# Patient Record
Sex: Female | Born: 1960 | Race: White | Hispanic: No | Smoking: Former smoker
Health system: Southern US, Community
[De-identification: ages and names within clinical notes are randomized; demographics above are authoritative.]

## PROBLEM LIST (undated history)

## (undated) DIAGNOSIS — I639 Cerebral infarction, unspecified: Secondary | ICD-10-CM

## (undated) HISTORY — PX: COLON SURGERY: SHX602

## (undated) HISTORY — PX: COLOSTOMY: SHX63

---

## 2005-12-14 ENCOUNTER — Emergency Department (HOSPITAL_COMMUNITY): Admission: EM | Admit: 2005-12-14 | Discharge: 2005-12-14 | Payer: Self-pay | Admitting: Emergency Medicine

## 2006-01-07 ENCOUNTER — Emergency Department (HOSPITAL_COMMUNITY): Admission: EM | Admit: 2006-01-07 | Discharge: 2006-01-07 | Payer: Self-pay | Admitting: Emergency Medicine

## 2007-10-13 ENCOUNTER — Emergency Department: Payer: Self-pay | Admitting: Emergency Medicine

## 2008-07-17 ENCOUNTER — Inpatient Hospital Stay: Payer: Self-pay | Admitting: Internal Medicine

## 2009-11-30 IMAGING — CR DG ABDOMEN 2V
1 series · 3 of 3 positions shown · non-contrast
Comparison: none

REASON FOR EXAM: follow up [REDACTED] obst.
COMMENTS:

[Series 1: view not recorded · 0.17mm/px · 3 of 3 slices shown]
[im 1/3]
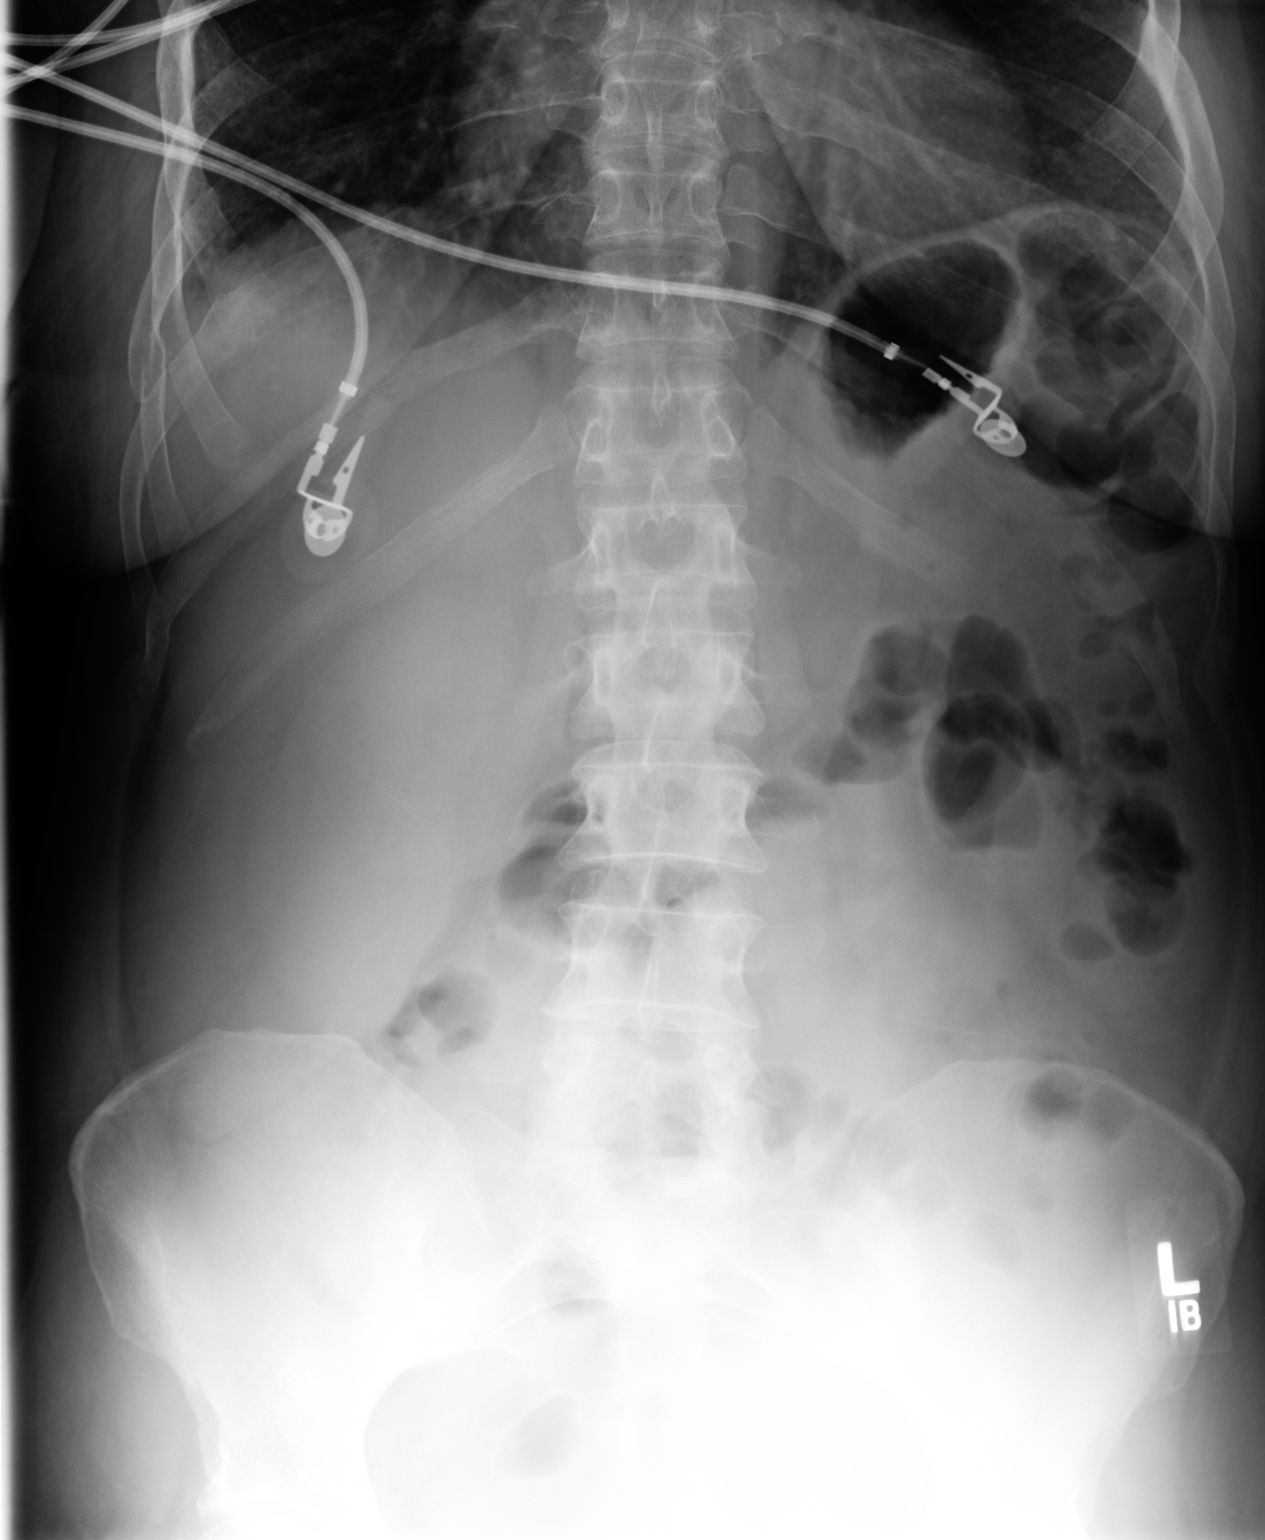
[im 2/3]
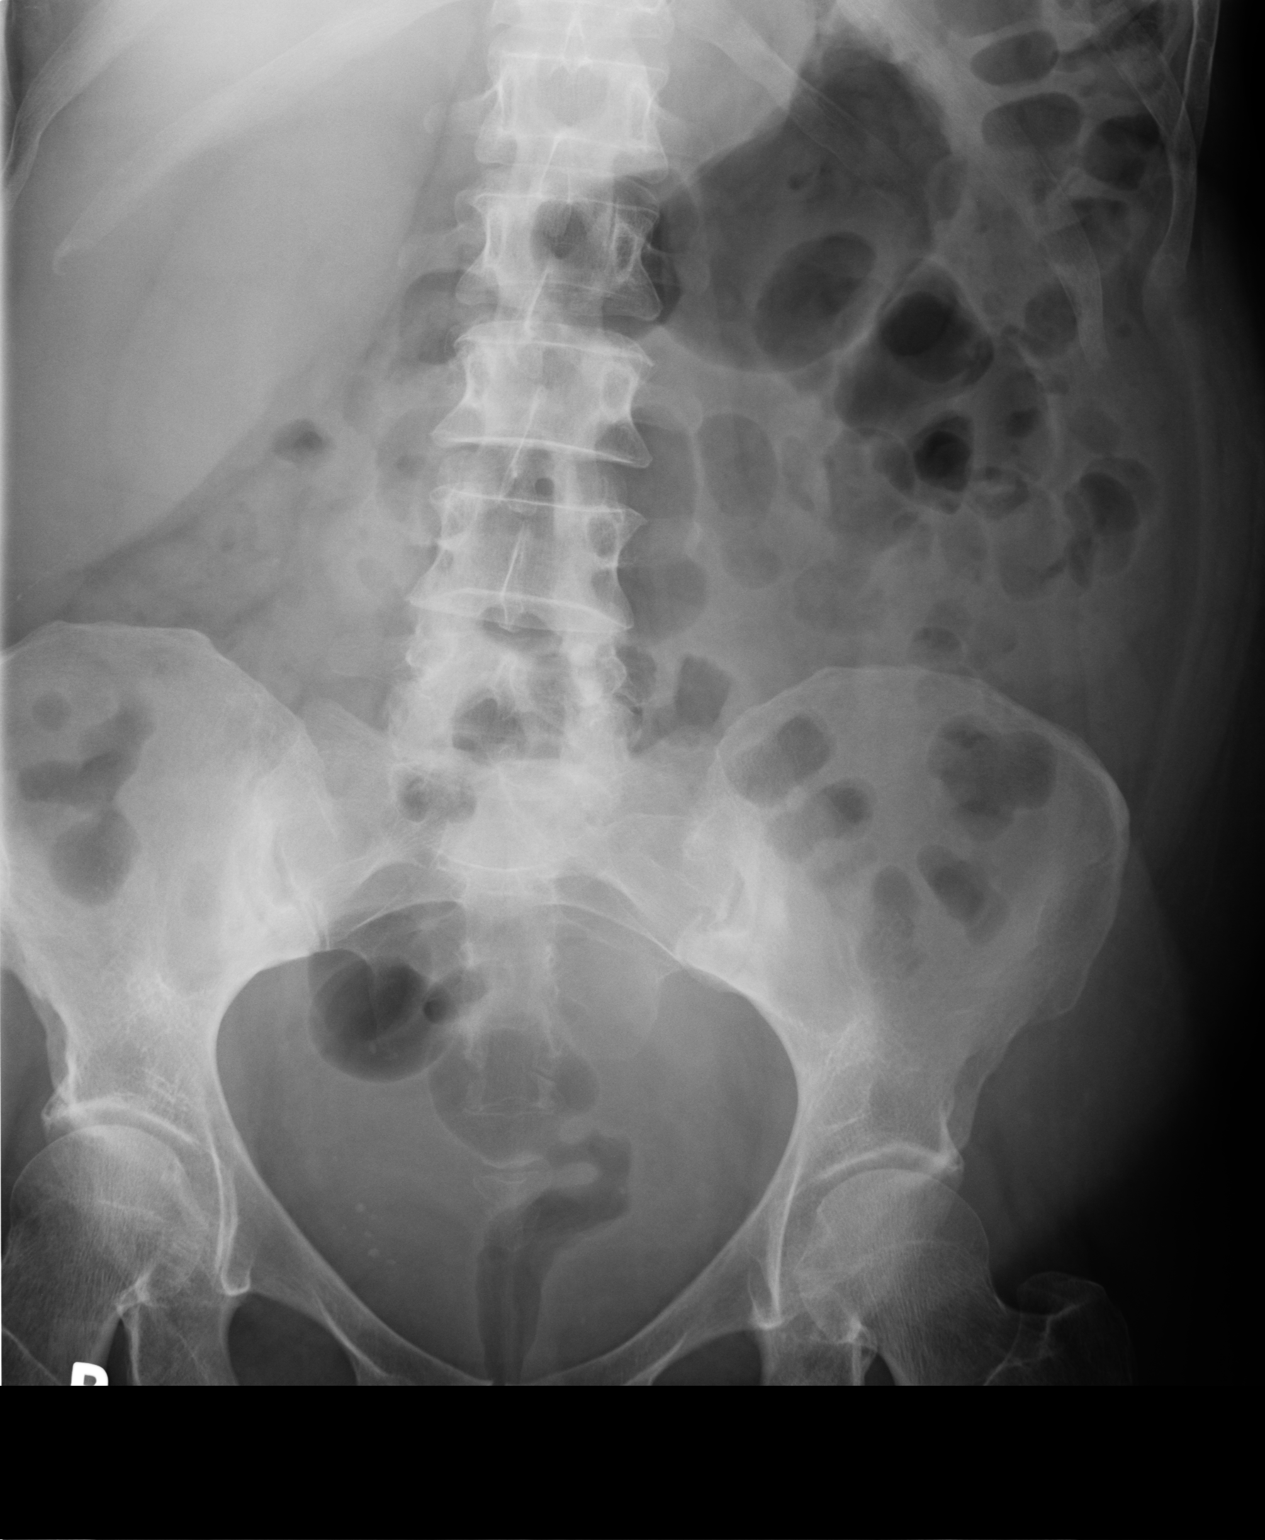
[im 3/3]
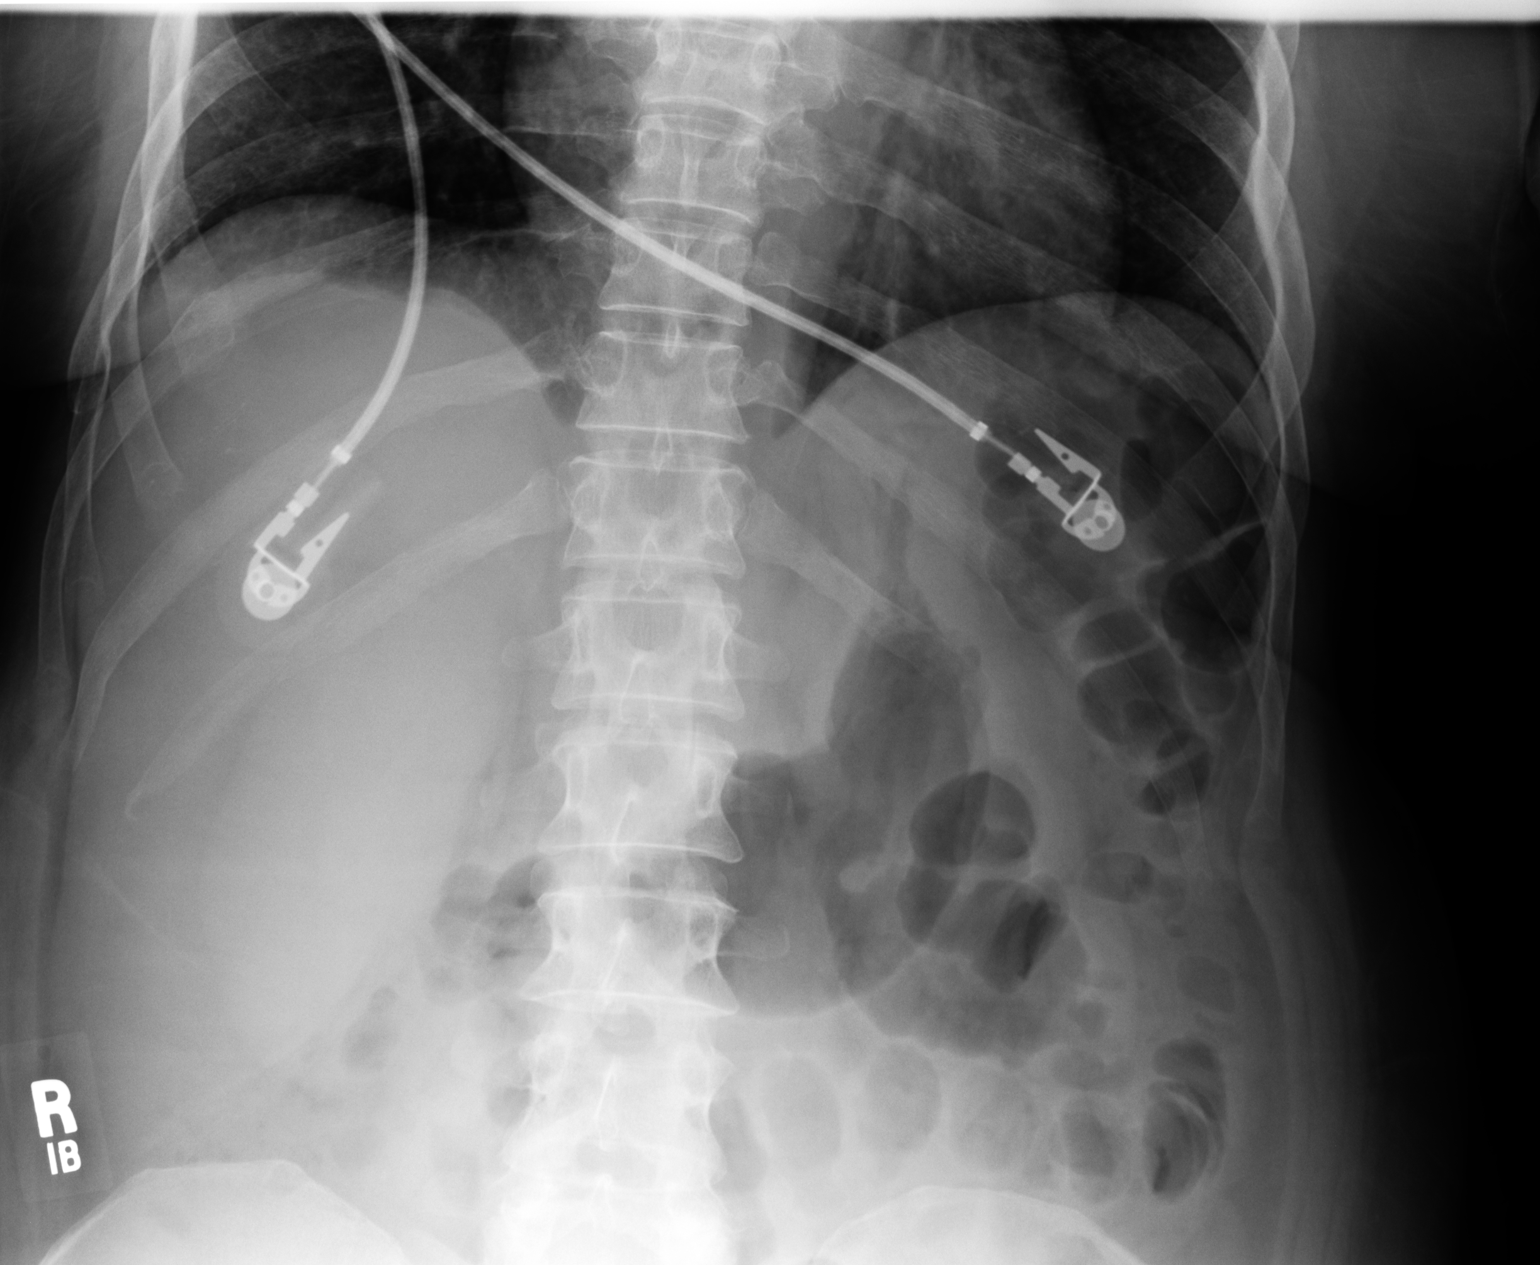

[3 of 3 positions shown; findings below may reference images not displayed]

PROCEDURE:     DXR - DXR ABDOMEN 2 V FLAT AND ERECT  - July 24, 2008  [DATE]

RESULT:     Comparison is made to images dated 07/19/2008 as well as 07/21/2008.

The bowel gas pattern appears to be unremarkable with air and stool
scattered through the colon to the rectum. There is no abnormal bowel
distention. There is no free air. The lung bases are clear.
IMPRESSION: No acute abnormality. No evidence of bowel obstruction or
perforation.

## 2015-02-27 ENCOUNTER — Encounter: Payer: Self-pay | Admitting: *Deleted

## 2015-02-27 ENCOUNTER — Inpatient Hospital Stay
Admission: EM | Admit: 2015-02-27 | Discharge: 2015-03-03 | DRG: 389 | Disposition: A | Discharge status: Home / Self Care | Payer: Medicaid Other | Attending: Surgery | Admitting: Surgery

## 2015-02-27 DIAGNOSIS — R1084 Generalized abdominal pain: Secondary | ICD-10-CM | POA: Insufficient documentation

## 2015-02-27 DIAGNOSIS — Z933 Colostomy status: Secondary | ICD-10-CM

## 2015-02-27 DIAGNOSIS — K439 Ventral hernia without obstruction or gangrene: Secondary | ICD-10-CM | POA: Diagnosis present

## 2015-02-27 DIAGNOSIS — Z4659 Encounter for fitting and adjustment of other gastrointestinal appliance and device: Secondary | ICD-10-CM

## 2015-02-27 DIAGNOSIS — K565 Intestinal adhesions [bands], unspecified as to partial versus complete obstruction: Secondary | ICD-10-CM | POA: Insufficient documentation

## 2015-02-27 DIAGNOSIS — K5090 Crohn's disease, unspecified, without complications: Secondary | ICD-10-CM | POA: Diagnosis present

## 2015-02-27 DIAGNOSIS — N390 Urinary tract infection, site not specified: Secondary | ICD-10-CM

## 2015-02-27 DIAGNOSIS — K56609 Unspecified intestinal obstruction, unspecified as to partial versus complete obstruction: Secondary | ICD-10-CM | POA: Diagnosis present

## 2015-02-27 HISTORY — DX: Cerebral infarction, unspecified: I63.9

## 2015-02-27 LAB — COMPREHENSIVE METABOLIC PANEL
ALK PHOS: 46 U/L (ref 38–126)
ALT: 14 U/L (ref 14–54)
AST: 18 U/L (ref 15–41)
Albumin: 3.9 g/dL (ref 3.5–5.0)
Anion gap: 8 (ref 5–15)
BILIRUBIN TOTAL: 0.6 mg/dL (ref 0.3–1.2)
BUN: 23 mg/dL — ABNORMAL HIGH (ref 6–20)
CALCIUM: 9.3 mg/dL (ref 8.9–10.3)
CO2: 25 mmol/L (ref 22–32)
CREATININE: 1.33 mg/dL — AB (ref 0.44–1.00)
Chloride: 109 mmol/L (ref 101–111)
GFR, EST AFRICAN AMERICAN: 52 mL/min — AB (ref >60)
GFR, EST NON AFRICAN AMERICAN: 45 mL/min — AB (ref >60)
Glucose, Bld: 107 mg/dL — ABNORMAL HIGH (ref 65–99)
Potassium: 3.9 mmol/L (ref 3.5–5.1)
Sodium: 142 mmol/L (ref 135–145)
TOTAL PROTEIN: 6.9 g/dL (ref 6.5–8.1)

## 2015-02-27 LAB — LIPASE, BLOOD: Lipase: 48 U/L (ref 22–51)

## 2015-02-27 LAB — CBC
HCT: 43.9 % (ref 35.0–47.0)
Hemoglobin: 14.5 g/dL (ref 12.0–16.0)
MCH: 29.4 pg (ref 26.0–34.0)
MCHC: 33 g/dL (ref 32.0–36.0)
MCV: 89.1 fL (ref 80.0–100.0)
PLATELETS: 252 10*3/uL (ref 150–440)
RBC: 4.92 MIL/uL (ref 3.80–5.20)
RDW: 14.2 % (ref 11.5–14.5)
WBC: 11.9 10*3/uL — AB (ref 3.6–11.0)

## 2015-02-27 NOTE — ED Notes (Addendum)
Pt states she has had generalized abdominal pain that radiates to her back for several days, denies n/v, "thicker" stool output from her colostomy today. Hx of chron's, feels similar.

## 2015-02-28 ENCOUNTER — Inpatient Hospital Stay: Payer: Medicaid Other

## 2015-02-28 ENCOUNTER — Emergency Department: Payer: Medicaid Other

## 2015-02-28 DIAGNOSIS — Z933 Colostomy status: Secondary | ICD-10-CM | POA: Diagnosis not present

## 2015-02-28 DIAGNOSIS — K5090 Crohn's disease, unspecified, without complications: Secondary | ICD-10-CM | POA: Diagnosis present

## 2015-02-28 DIAGNOSIS — R1084 Generalized abdominal pain: Secondary | ICD-10-CM | POA: Diagnosis not present

## 2015-02-28 DIAGNOSIS — K439 Ventral hernia without obstruction or gangrene: Secondary | ICD-10-CM | POA: Diagnosis present

## 2015-02-28 DIAGNOSIS — K56609 Unspecified intestinal obstruction, unspecified as to partial versus complete obstruction: Secondary | ICD-10-CM | POA: Diagnosis present

## 2015-02-28 DIAGNOSIS — K565 Intestinal adhesions [bands] with obstruction (postprocedural) (postinfection): Secondary | ICD-10-CM | POA: Diagnosis not present

## 2015-02-28 LAB — URINALYSIS COMPLETE WITH MICROSCOPIC (ARMC ONLY)
Bilirubin Urine: NEGATIVE
GLUCOSE, UA: NEGATIVE mg/dL
Hgb urine dipstick: NEGATIVE
Ketones, ur: NEGATIVE mg/dL
NITRITE: NEGATIVE
PROTEIN: NEGATIVE mg/dL
SPECIFIC GRAVITY, URINE: 1.01 (ref 1.005–1.030)
pH: 5 (ref 5.0–8.0)

## 2015-02-28 MED ORDER — ONDANSETRON HCL 4 MG/2ML IJ SOLN
4.0000 mg | Freq: Three times a day (TID) | INTRAMUSCULAR | Status: DC | PRN
  Administered 2015-02-28 – 2015-03-01 (×2): 4 mg via INTRAVENOUS
  Filled 2015-02-28 (×2): qty 2

## 2015-02-28 MED ORDER — PANTOPRAZOLE SODIUM 40 MG IV SOLR
40.0000 mg | INTRAVENOUS | Status: DC
  Administered 2015-02-28 – 2015-03-03 (×2): 40 mg via INTRAVENOUS
  Filled 2015-02-28 (×2): qty 40

## 2015-02-28 MED ORDER — ONDANSETRON HCL 4 MG/2ML IJ SOLN
INTRAMUSCULAR | Status: AC
  Administered 2015-02-28: 4 mg via INTRAVENOUS
  Filled 2015-02-28: qty 2

## 2015-02-28 MED ORDER — HEPARIN SODIUM (PORCINE) 5000 UNIT/ML IJ SOLN
5000.0000 [IU] | Freq: Three times a day (TID) | INTRAMUSCULAR | Status: DC
  Filled 2015-02-28 (×4): qty 1

## 2015-02-28 MED ORDER — SODIUM CHLORIDE 0.9 % IV SOLN
INTRAVENOUS | Status: DC
  Administered 2015-02-28 – 2015-03-01 (×3): via INTRAVENOUS

## 2015-02-28 MED ORDER — MORPHINE SULFATE (PF) 4 MG/ML IV SOLN
4.0000 mg | Freq: Once | INTRAVENOUS | Status: AC
  Administered 2015-02-28: 4 mg via INTRAVENOUS
  Filled 2015-02-28: qty 1

## 2015-02-28 MED ORDER — IOHEXOL 240 MG/ML SOLN: 25.0000 mL | Freq: Once | INTRAMUSCULAR | Status: DC | PRN

## 2015-02-28 MED ORDER — MORPHINE SULFATE (PF) 4 MG/ML IV SOLN
INTRAVENOUS | Status: AC
  Filled 2015-02-28: qty 1

## 2015-02-28 MED ORDER — SODIUM CHLORIDE 0.9 % IV BOLUS (SEPSIS)
1000.0000 mL | Freq: Once | INTRAVENOUS | Status: AC
  Administered 2015-02-28: 1000 mL via INTRAVENOUS

## 2015-02-28 MED ORDER — BENZOCAINE 20 % MT SOLN
OROMUCOSAL | Status: AC
  Administered 2015-02-28: 1
  Filled 2015-02-28: qty 5

## 2015-02-28 MED ORDER — IOHEXOL 300 MG/ML  SOLN
75.0000 mL | Freq: Once | INTRAMUSCULAR | Status: AC | PRN
  Administered 2015-02-28: 75 mL via INTRAVENOUS

## 2015-02-28 MED ORDER — ONDANSETRON HCL 4 MG/2ML IJ SOLN
4.0000 mg | Freq: Once | INTRAMUSCULAR | Status: AC
  Administered 2015-02-28: 4 mg via INTRAVENOUS

## 2015-02-28 MED ORDER — MORPHINE SULFATE (PF) 4 MG/ML IV SOLN
4.0000 mg | Freq: Once | INTRAVENOUS | Status: AC
  Administered 2015-02-28: 4 mg via INTRAVENOUS

## 2015-02-28 MED ORDER — HYDROMORPHONE HCL 1 MG/ML IJ SOLN
0.5000 mg | INTRAMUSCULAR | Status: DC | PRN
  Administered 2015-02-28 – 2015-03-03 (×12): 0.5 mg via INTRAVENOUS
  Filled 2015-02-28 (×11): qty 1

## 2015-02-28 MED ORDER — ONDANSETRON HCL 4 MG/2ML IJ SOLN
4.0000 mg | Freq: Once | INTRAMUSCULAR | Status: AC
  Administered 2015-02-28: 4 mg via INTRAVENOUS
  Filled 2015-02-28: qty 2

## 2015-02-28 MED ORDER — BENZOCAINE (TOPICAL) 20 % EX AERO: INHALATION_SPRAY | Freq: Four times a day (QID) | CUTANEOUS | Status: DC | PRN

## 2015-02-28 MED ORDER — HYDROMORPHONE HCL 1 MG/ML IJ SOLN
INTRAMUSCULAR | Status: AC
  Administered 2015-02-28: 0.5 mg via INTRAVENOUS
  Filled 2015-02-28: qty 1

## 2015-02-28 MED ORDER — IOHEXOL 240 MG/ML SOLN
50.0000 mL | Freq: Once | INTRAMUSCULAR | Status: AC | PRN
  Administered 2015-02-28: 50 mL via ORAL

## 2015-02-28 MED ORDER — BENZOCAINE (TOPICAL) 20 % EX AERO: INHALATION_SPRAY | Freq: Once | CUTANEOUS | Status: AC

## 2015-02-28 NOTE — H&P (Addendum)
CC: Lower abdominal pain x 2-3 days.  HPI: Michelle Thornton is a pleasant 54 yo F with a history of approx 1 month of abdominal pain which has gotten more severe over the past 2-3 days.  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  Still with ostomy output.  Pain is located over LLQ below ostomy.  No fevers/chills, night sweats, shortness of breath, cough, chest pain, dysuria/hematuria.  Active Ambulatory Problems    Diagnosis Date Noted  . No Active Ambulatory Problems   Resolved Ambulatory Problems    Diagnosis Date Noted  . No Resolved Ambulatory Problems   Past Medical History  Diagnosis Date  . Stroke    Past Surgical History  Procedure Laterality Date  . Colon surgery    . Colostomy    H/o open cholecystectomy     Medication List    Notice    You have not been prescribed any medications.    No Known Allergies   Social History   Social History  . Marital Status: Divorced    Spouse Name: N/A  . Number of Children: N/A  . Years of Education: N/A   Occupational History  . Not on file.   Social History Main Topics  . Smoking status: Never Smoker   . Smokeless tobacco: Not on file  . Alcohol Use: No  . Drug Use: No  . Sexual Activity: Not on file   Other Topics Concern  . Not on file   Social History Narrative  No family history on file.   ROS: Full ROS obtained, pertinent positives and negatives as above  Blood pressure 139/83, pulse 82, temperature 98.8 F (37.1 C), temperature source Oral, resp. rate 16, height 5\' 5"  (1.651 m), weight 180 lb (81.647 kg), SpO2 94 %. GEN: NAD/A&Ox3 FACE: no obvious facial trauma, normal external nose, normal external ears EYES: no scleral icterus, no conjunctivitis HEAD: normocephalic atraumatic CV: RRR, no MRG RESP: moving air well, lungs clear ABD: soft, mild tender LLQ, ostomy pink (appears to be an end on  exam), nondistended, nontender periumbilical hernia  EXT: moving all ext well, strength 5/5 NEURO: cnII-XII grossly intact, sensation intact all 4 ext  Labs: Personally reviewed, significant for WBC 11.9  Imaging: Personally reviewed, significant for  Dilated proximal SB loops, LLQ ventral wall hernia, distal SB decompression ? Contrast appears past obstruction and also in distal colon? Which would not be consistent with an ileostomy  A/P 54 yo with likely partial SBO from ventral hernia.  No indication for emergent surgery (no tachycardia, no s/sx of bowel ischemia).  Will treat as SBO with NG decompression.  Obtain OP records.  I have discussed plan with patient and she agrees.

## 2015-02-28 NOTE — ED Provider Notes (Signed)
[facility] Emergency Department Provider Note  ____________________________________________  Time seen: Approximately 12:46 AM  I have reviewed the triage vital signs and the nursing notes.   HISTORY  Chief Complaint Abdominal Pain    HPI Michelle Thornton is a 54 y.o. female who presents to the ED from home with a chief complain of abdominal pain. Patient has a history significant for Crohn's disease s/p colostomy. Complains of a several day history of generalized abdominal pain, cramping in nature, waxing/waning which has progressively worsened. Symptoms are associated with nausea only. Patient states she has been experiencing alternating constipation and diarrhea from her ostomy bag. Denies fever, chills, chest pain, shortness of breath, dysuria, headache, weakness, numbness, tingling. Denies recent travel. Recently finished a 10 day course of steroids for COPD exacerbation. She does not take maintenance medicines for her Crohn's disease.   Past Medical History  Diagnosis Date  . Stroke   Crohn's disease SBO COPD  There are no active problems to display for this patient.   Past Surgical History  Procedure Laterality Date  . Colon surgery    . Colostomy      No current outpatient prescriptions on file.  Allergies Review of patient's allergies indicates no known allergies.  No family history on file.  Social History Social History  Substance Use Topics  . Smoking status: Never Smoker   . Smokeless tobacco: None  . Alcohol Use: No    Review of Systems Constitutional: No fever/chills Eyes: No visual changes. ENT: No sore throat. Cardiovascular: Denies chest pain. Respiratory: Denies shortness of breath. Gastrointestinal: Positive for abdominal pain.  Positive for nausea, no vomiting.  No diarrhea.  No constipation. Genitourinary: Negative for dysuria. Musculoskeletal: Negative for back pain. Skin: Negative for rash. Neurological:  Negative for headaches, focal weakness or numbness.  10-point ROS otherwise negative.  ____________________________________________   PHYSICAL EXAM:  VITAL SIGNS: ED Triage Vitals  Enc Vitals Group     BP 02/27/15 2306 105/75 mmHg     Pulse Rate 02/27/15 2306 77     Resp 02/27/15 2306 18     Temp 02/27/15 2306 98.8 F (37.1 C)     Temp Source 02/27/15 2306 Oral     SpO2 02/27/15 2306 95 %     Weight 02/27/15 2306 180 lb (81.647 kg)     Height 02/27/15 2306  (1.651 m)     Head Cir --      Peak Flow --      Pain Score 02/27/15 2306 6     Pain Loc --      Pain Edu? --      Excl. in GC? --     Constitutional: Alert and oriented. Well appearing and in mild acute distress. Eyes: Conjunctivae are normal. PERRL. EOMI. Head: Atraumatic. Nose: No congestion/rhinnorhea. Mouth/Throat: Mucous membranes are moist.  Oropharynx non-erythematous. Neck: No stridor.   Cardiovascular: Normal rate, regular rhythm. Grossly normal heart sounds.  Good peripheral circulation. Respiratory: Normal respiratory effort.  No retractions. Lungs CTAB. Gastrointestinal: Soft, tender to palpation diffusely without rebound or guarding. Moderate distention. Stool in ostomy bag. No abdominal bruits. No CVA tenderness. Musculoskeletal: No lower extremity tenderness nor edema.  No joint effusions. Neurologic:  Normal speech and language. No gross focal neurologic deficits are appreciated. No gait instability. Skin:  Skin is warm, dry and intact. No rash noted. Psychiatric: Mood and affect are normal. Speech and behavior are normal.  ____________________________________________   LABS (all labs ordered are listed,  but only abnormal results are displayed)  Labs Reviewed  COMPREHENSIVE METABOLIC PANEL - Abnormal; Notable for the following:    Glucose, Bld 107 (*)    BUN 23 (*)    Creatinine, Ser 1.33 (*)    GFR calc non Af Amer 45 (*)    GFR calc Af Amer 52 (*)    All other components within normal  limits  CBC - Abnormal; Notable for the following:    WBC 11.9 (*)    All other components within normal limits  URINALYSIS COMPLETEWITH MICROSCOPIC ([facility] ONLY) - Abnormal; Notable for the following:    Color, Urine STRAW (*)    APPearance CLEAR (*)    Leukocytes, UA 1+ (*)    Bacteria, UA RARE (*)    Squamous Epithelial / LPF 0-5 (*)    All other components within normal limits  LIPASE, BLOOD   ____________________________________________  EKG  ED ECG REPORT I, SUNG,JADE J, the attending physician, personally viewed and interpreted this ECG.   Date: 02/28/2015  EKG Time: 2320  Rate: 68  Rhythm: normal EKG, normal sinus rhythm  Axis: Normal  Intervals:none  ST&T Change: Nonspecific  ____________________________________________  RADIOLOGY  CT Abdomen/Pelvis interpreted per Dr. Manus Gunning: 1. Findings consistent with small-bowel obstruction, with transition point likely related to left lower ventral abdominal wall hernia. 2. Postsurgical change with ostomy in the right lower quadrant and moderate parastomal hernia. 3. Mild soft tissue stranding in the pelvis adjacent to the dilated small bowel loop, likely related small bowel obstruction. Active Crohn's disease not entirely excluded, however there is no definite bowel wall thickening. There is otherwise no evidence of active Crohn's disease. ____________________________________________   PROCEDURES  Procedure(s) performed: None  Critical Care performed: No  ____________________________________________   INITIAL IMPRESSION / ASSESSMENT AND PLAN / ED COURSE  Pertinent labs & imaging results that were available during my care of the patient were reviewed by me and considered in my medical decision making (see chart for details).  54 year old female with a significant history for Crohn's disease s/p colostomy who presents with a several day history of generalized abdominal pain associated with nausea. Will initiate  IV fluid resuscitation, IV analgesia and obtain CT abdomen/pelvis to evaluate infectious/inflammatory etiology of patient's pain. Will hold off on steroids for now given patient's recent 10 day course of prednisone.  ----------------------------------------- 4:34 AM on 02/28/2015 -----------------------------------------  Updated patient of CT findings of SBO. Discussed case with Dr. Juliann Pulse who will evaluate patient in the ED for admission. Will place NG tube for bowel decompression. ____________________________________________   FINAL CLINICAL IMPRESSION(S) / ED DIAGNOSES  Final diagnoses:  Generalized abdominal pain  UTI (lower urinary tract infection)      Irean Hong, MD 02/28/15 [phone]

## 2015-02-28 NOTE — Progress Notes (Signed)
Pt refused heparin, will have day shift nurse pass on to MD. Rochele Pages protonix with a lot of explanation about medication.

## 2015-02-28 NOTE — Progress Notes (Signed)
Pt assisted tio bedside commode for toileting, NGTube to moderate continuous suction  Thick brown  With odor

## 2015-02-28 NOTE — Progress Notes (Signed)
Spoke with dr Michela Pitcher pt may have ice chips

## 2015-02-28 NOTE — ED Notes (Signed)
MD at bedside. 

## 2015-02-28 NOTE — Progress Notes (Addendum)
Pt developed sharp abdominal pain. NG tube was hooked to intermittent low suction at the time when pain developed. Suction turned off and abdominal pain subsided. NG tube in at 20 when pt was transferred to floor. Dr. Excell Seltzer notified. New order for KUB. Will report results to MD & continue to monitor.

## 2015-02-28 NOTE — Progress Notes (Signed)
Pt sleeping no complaints at present

## 2015-02-28 NOTE — Progress Notes (Signed)
Pt left floor with orderly to 2C.

## 2015-02-28 NOTE — Progress Notes (Signed)
Dr Orvis Brill examining pt  Irrigated NG tube , drained 400 brow liquid .

## 2015-02-28 NOTE — Progress Notes (Signed)
Report called to [facility] on General Surgery.

## 2015-03-01 ENCOUNTER — Inpatient Hospital Stay: Payer: Medicaid Other

## 2015-03-01 MED ORDER — ACETAMINOPHEN 120 MG RE SUPP
120.0000 mg | Freq: Once | RECTAL | Status: AC
  Administered 2015-03-01: 120 mg via RECTAL
  Filled 2015-03-01: qty 1

## 2015-03-01 MED ORDER — NICOTINE 10 MG IN INHA
1.0000 | RESPIRATORY_TRACT | Status: DC | PRN
  Administered 2015-03-01: 1 via RESPIRATORY_TRACT
  Filled 2015-03-01: qty 36

## 2015-03-01 MED ORDER — ACETAMINOPHEN 80 MG RE SUPP
80.0000 mg | Freq: Once | RECTAL | Status: DC
  Filled 2015-03-01: qty 1

## 2015-03-01 MED ORDER — KCL IN DEXTROSE-NACL 40-5-0.45 MEQ/L-%-% IV SOLN
INTRAVENOUS | Status: DC
  Administered 2015-03-01 – 2015-03-02 (×4): via INTRAVENOUS
  Filled 2015-03-01 (×7): qty 1000

## 2015-03-01 NOTE — Progress Notes (Addendum)
Pt transferred from 1A Room 149 to Room 216. Received report from [place] on 1A.  Pt A&O x4. IV access intact & infusing. Pt with NG tube to low intermittent wall suction. Medications administered as ordered (See MAR). Admission & Assessment completed.   Oriented pt to room. Education provided on call bell, telephone, IVpole/IV alarms. Education presented on white board as a pt/nurse Public relations account executive. White board completed and updated as needed.  Reviewed diet/medication orders with pt. Addressed all of the pt questions & concerns.  Pt has vaporizer in room. Educated pt on reasons not to use it while in hospital. Pt states "I am going to use it anyway regardless".  VSS. Will continue to monitor.

## 2015-03-01 NOTE — Progress Notes (Signed)
T 100.5 All other VSS. Dr. Excell Seltzer notified. New order for 1 tylenol suppository now.

## 2015-03-01 NOTE — Progress Notes (Signed)
Subjective:  she is still having moderate pain but is more comfortable since her last evaluation. She does not have any nausea and does appear to have betterN G output. She has films pending this morning.  Vital signs in last 24 hours: Temp:  [97.7 F (36.5 C)-100.5 F (38.1 C)] 98.7 F (37.1 C) (09/17 0650) Pulse Rate:  [57-69] 66 (09/17 0650) Resp:  [16-24] 16 (09/17 0650) BP: (101-135)/(52-74) 103/52 mmHg (09/17 0650) SpO2:  [93 %-96 %] 93 % (09/17 0650) Last BM Date: 02/28/15  Intake/Output from previous day: 09/16 0701 - 09/17 0700 In: 2396 [I.V.:2046; NG/GT:350] Out: 2340 [Urine:1650; Emesis/NG output:640]  GI: her abdomen is soft and she does not appear to have any incarceration of either hernia in the midline. She has hypoactive but present bowel sounds with some high-pitched tinkling sounds. She does have good output in her ostomy.N  Lab Results:  CBC  Recent Labs  02/27/15 2310  WBC 11.9*  HGB 14.5  HCT 43.9  PLT 252   CMP     Component Value Date/Time   NA 142 02/27/2015 2310   K 3.9 02/27/2015 2310   CL 109 02/27/2015 2310   CO2 25 02/27/2015 2310   GLUCOSE 107* 02/27/2015 2310   BUN 23* 02/27/2015 2310   CREATININE 1.33* 02/27/2015 2310   CALCIUM 9.3 02/27/2015 2310   PROT 6.9 02/27/2015 2310   ALBUMIN 3.9 02/27/2015 2310   AST 18 02/27/2015 2310   ALT 14 02/27/2015 2310   ALKPHOS 46 02/27/2015 2310   BILITOT 0.6 02/27/2015 2310   GFRNONAA 45* 02/27/2015 2310   GFRAA 52* 02/27/2015 2310   PT/INR No results for input(s): LABPROT, INR in the last 72 hours.  Studies/Results: Dg Abd 1 View  02/28/2015   CLINICAL DATA:  NG tube placement.  EXAM: ABDOMEN - 1 VIEW  COMPARISON:  CT performed yesterday.  FINDINGS: An enteric tube is not visualized in the upper abdomen or lower most lung bases. There is gaseous distention of stomach and small bowel in the central abdomen.  IMPRESSION: Enteric tube not visualized. Repeat exam recommended after  advancement to include the lower thorax and upper abdomen.  These results were called by telephone at the time of interpretation on 02/28/2015 at 11:17 pm to Janyce Llanos, nurse caring for the patient, who is aware can verbally acknowledged these results.   Electronically Signed   By: Rubye Oaks M.D.   On: 02/28/2015 23:18   Ct Abdomen Pelvis W Contrast  02/28/2015   CLINICAL DATA:  Generalized abdominal pain radiating to the back for several days, nausea and bloating. History of Crohn's with colostomy.  EXAM: CT ABDOMEN AND PELVIS WITH CONTRAST  TECHNIQUE: Multidetector CT imaging of the abdomen and pelvis was performed using the standard protocol following bolus administration of intravenous contrast.  CONTRAST:  75mL OMNIPAQUE IOHEXOL 300 MG/ML  SOLN  COMPARISON:  CT 07/18/2008  FINDINGS: Minimal atelectasis in the left lower lobe. The previous small pleural-based nodule is no longer seen.  The stomach is distended with contrast and ingested material. There are dilated small bowel loops in the central and upper abdomen. A transition point is seen related to left lateral lower abdominal wall ventral hernia, with bowel loops distal to this decompressed. Right lower quadrant ostomy with peristomal hernia, no bowel thickening in the parastomal hernia. Upper abdominal wall ventral hernia contains small focus of in situ transverse colon. Colon distal to this is decompressed, however there is mild soft tissue stranding about  the sigmoid colon in the pelvis which is adjacent to a dilated small bowel loop. Mild mesenteric edema in the central mesentery. There is otherwise no evidence of active Crohn's disease. Laxity of the anterior abdominal wall with broad-based periumbilical hernia containing small bowel loops.  Clips in the gallbladder fossa from cholecystectomy. No disproportionate biliary dilatation. The liver appears prominent without focal lesion. The spleen, adrenal glands, and pancreas are normal.  Kidneys demonstrate symmetric enhancement and excretion. Small left renal cyst.  No free air, free fluid, or intra-abdominal fluid collection.  No retroperitoneal adenopathy. Abdominal aorta is normal in caliber.  Within the pelvis the bladder is distended. Uterus remains in situ. No adnexal mass. No pelvic free fluid.  There are no acute or suspicious osseous abnormalities.  IMPRESSION: 1. Findings consistent with small-bowel obstruction, with transition point likely related to left lower ventral abdominal wall hernia. 2. Postsurgical change with ostomy in the right lower quadrant and moderate parastomal hernia. 3. Mild soft tissue stranding in the pelvis adjacent to the dilated small bowel loop, likely related small bowel obstruction. Active Crohn's disease not entirely excluded, however there is no definite bowel wall thickening. There is otherwise no evidence of active Crohn's disease.   Electronically Signed   By: Rubye Oaks M.D.   On: 02/28/2015 04:02    Assessment/Plan: Overall she appears to be improving. She does not have any indications for urgent surgery. We will repeat her films today. We would really like to avoid surgery if possible because of her multiple previous operations. There does not appear to be any evidence of obstruction at this time.

## 2015-03-01 NOTE — Progress Notes (Signed)
Dr. Excell Seltzer notified nurse that KUB results showed NG tube was not in stomach. New orders to advance the tube. Advanced tube to 50 and verified placement. Pt tolerated procedure. NG tube to low intermittent suction as ordered. Scant amount of output at this time. Notified Dr. Excell Seltzer. Will continue to monitor.   Radiology also called to notify me that KUB showed NG tube was not in stomach.

## 2015-03-01 NOTE — Progress Notes (Signed)
Spoke with patient about use of personal vapor with nicotine, offered ordered nicotine inhaler, pt tried prescribed nicotine inhaler, but showed disinterest in it. Pt resting in room. continue to assess.

## 2015-03-02 DIAGNOSIS — K565 Intestinal adhesions [bands], unspecified as to partial versus complete obstruction: Secondary | ICD-10-CM | POA: Insufficient documentation

## 2015-03-02 DIAGNOSIS — R1084 Generalized abdominal pain: Secondary | ICD-10-CM

## 2015-03-02 LAB — CBC
HEMATOCRIT: 40.9 % (ref 35.0–47.0)
Hemoglobin: 13.5 g/dL (ref 12.0–16.0)
MCH: 29.4 pg (ref 26.0–34.0)
MCHC: 33 g/dL (ref 32.0–36.0)
MCV: 89.1 fL (ref 80.0–100.0)
PLATELETS: 205 10*3/uL (ref 150–440)
RBC: 4.59 MIL/uL (ref 3.80–5.20)
RDW: 14.2 % (ref 11.5–14.5)
WBC: 6.7 10*3/uL (ref 3.6–11.0)

## 2015-03-02 LAB — BASIC METABOLIC PANEL
ANION GAP: 4 — AB (ref 5–15)
BUN: 6 mg/dL (ref 6–20)
CO2: 27 mmol/L (ref 22–32)
Calcium: 8.7 mg/dL — ABNORMAL LOW (ref 8.9–10.3)
Chloride: 111 mmol/L (ref 101–111)
Creatinine, Ser: 0.7 mg/dL (ref 0.44–1.00)
GFR calc Af Amer: >60 mL/min (ref >60)
GLUCOSE: 115 mg/dL — AB (ref 65–99)
POTASSIUM: 4.1 mmol/L (ref 3.5–5.1)
Sodium: 142 mmol/L (ref 135–145)

## 2015-03-02 MED ORDER — ACETAMINOPHEN 325 MG PO TABS
650.0000 mg | ORAL_TABLET | Freq: Once | ORAL | Status: AC
  Administered 2015-03-02: 650 mg via ORAL
  Filled 2015-03-02: qty 2

## 2015-03-02 NOTE — Progress Notes (Signed)
Michelle Thornton is a 54 y.o. 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 Patient doing well with ng tube out, denies any nausea or abdominal pain this AM.  States she has some soreness in the epigastric region.  She is have gas and stool in the ostomy bag  Filed Vitals:   03/02/15 0526  BP: 98/51  Pulse: 53  Temp: 97.8 F (36.6 C)  Resp: 16   I/O last 3 completed shifts: In: 2998.7 [I.V.:2698.7; NG/GT:300] Out: 790 [Urine:650; Emesis/NG output:90; Other:50] Total I/O In: 465 [I.V.:465] Out: -   General appearance: alert, cooperative and no distress Lungs: clear to auscultation bilaterally Heart: regular rate and rhythm Abdomen: obese, soft, non-tender, well healed surgical scars, RLQ ostomy pink patent, with air and stool in bag Extremities: 2+ pulses, no edema   Labs:  CBC Latest Ref Rng 03/02/2015 02/27/2015  WBC 3.6 - 11.0 K/uL 6.7 11.9(H)  Hemoglobin 12.0 - 16.0 g/dL 16.5 53.7  Hematocrit 48.2 - 47.0 % 40.9 43.9  Platelets 150 - 440 K/uL 205 252   BMP Latest Ref Rng 03/02/2015 02/27/2015  Glucose 65 - 99 mg/dL 707(E) 675(Q)  BUN 6 - 20 mg/dL 6 49(E)  Creatinine 0.10 - 1.00 mg/dL 0.71 2.19(X)  Sodium 588 - 145 mmol/L 142 142  Potassium 3.5 - 5.1 mmol/L 4.1 3.9  Chloride 101 - 111 mmol/L 111 109  CO2 22 - 32 mmol/L 27 25  Calcium 8.9 - 10.3 mg/dL 3.2(P) 9.3   Assessment/Plan: 54 yr old with small bowel obstruction  Improving will give clear liquids and advance as tolerates Decrease IVF

## 2015-03-02 NOTE — Progress Notes (Signed)
Pt c/o "migraine". Dr. Excell Seltzer notified. New orders for tylenol 650 mg PO with a sip of water x1 now. Will continue to monitor.

## 2015-03-03 MED ORDER — IBUPROFEN 600 MG PO TABS
600.0000 mg | ORAL_TABLET | ORAL | Status: DC | PRN
  Administered 2015-03-03: 600 mg via ORAL
  Filled 2015-03-03: qty 1

## 2015-03-03 NOTE — Discharge Instructions (Signed)

## 2015-03-03 NOTE — Clinical Documentation Improvement (Signed)
Hospitalist  Abnormal Lab/Test Results:  Urinalysis documented as +1 leukocytes and rare bacteria.  EDP documented UTI on admission dx.  Possible Clinical Conditions associated with below indicators  UTI  Other Condition  Cannot Clinically Determine   Supporting Information: See above  Treatment Provided: none   Please exercise your independent, professional judgment when responding. A specific answer is not anticipated or expected.   Thank Modesta Messing [facility] Health Information Management [place] [phone]

## 2015-03-03 NOTE — Discharge Summary (Signed)
Patient ID: Michelle Thornton MRN: [id number] DOB/AGE: 07-08-1960 54 y.o.  Admit date: 02/27/2015 Discharge date: 03/03/2015  Discharge Diagnoses:  Bowel Obstruction  Procedures Performed: None  Discharged Condition: good  Hospital Course: Admitted with SBO, improved with conservative therapy. Tolerated PO prior to discharge.  Discharge Orders: Discharge home - f/u PRN   Disposition: Home   Discharge Medications:  Current facility-administered medications:  .  dextrose 5 % and 0.45 % NaCl with KCl 40 mEq/L infusion, , Intravenous, Continuous, Gladis Riffle, MD, Last Rate: 50 mL/hr at 03/02/15 2321 .  heparin injection 5,000 Units, 5,000 Units, Subcutaneous, 3 times per day, Ida Rogue, MD, 5,000 Units at 02/28/15 [phone] .  ibuprofen (ADVIL,MOTRIN) tablet 600 mg, 600 mg, Oral, Q4H PRN, Ricarda Frame, MD, 600 mg at 03/03/15 1233 .  nicotine (NICOTROL) 10 MG inhaler 1 continuous puffing, 1 continuous puffing, Inhalation, PRN, Tiney Rouge III, MD, 1 continuous puffing at 03/01/15 1446 .  ondansetron (ZOFRAN) injection 4 mg, 4 mg, Intravenous, Q8H PRN, Ida Rogue, MD, 4 mg at 03/01/15 0044 .  pantoprazole (PROTONIX) injection 40 mg, 40 mg, Intravenous, Q24H, Ida Rogue, MD, 40 mg at 03/03/15 0515  Follwup: If wanted with [facility], otherwise follow up with [facility] surgeon for continued care.  Signed: Ricarda Frame 03/03/2015, 1:21 PM

## 2015-03-03 NOTE — Progress Notes (Signed)
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 Patient has been tolerating an oral diet without any nausea, vomiting. Patient's only complaint this morning is of a headache. Headache is sinus in quality and related to the same side that her NG tube was placed. Otherwise patient is returned to baseline has voiced interest in going home.  Vital signs in last 24 hours: Temp:  [97.7 F (36.5 C)-98.3 F (36.8 C)] 98.3 F (36.8 C) (09/19 0511) Pulse Rate:  [61-69] 64 (09/19 0513) Resp:  [18] 18 (09/19 0511) BP: (85-117)/(49-62) 98/60 mmHg (09/19 0513) SpO2:  [97 %-99 %] 97 % (09/19 0513) Last BM Date: 03/02/15  Intake/Output from previous day: 09/18 0701 - 09/19 0700 In: 1935.8 [P.O.:840; I.V.:1095.8] Out: 3000 [Urine:3000]  Exam:  Abdomen is soft, nontender, nondistended. Ostomy is pink patent and productive of stool in the right lower quadrant.  Lab Results:  CBC  Recent Labs  03/02/15 0549  WBC 6.7  HGB 13.5  HCT 40.9  PLT 205   CMP     Component Value Date/Time   NA 142 03/02/2015 0549   K 4.1 03/02/2015 0549   CL 111 03/02/2015 0549   CO2 27 03/02/2015 0549   GLUCOSE 115* 03/02/2015 0549   BUN 6 03/02/2015 0549   CREATININE 0.70 03/02/2015 0549   CALCIUM 8.7* 03/02/2015 0549   PROT 6.9 02/27/2015 2310   ALBUMIN 3.9 02/27/2015 2310   AST 18 02/27/2015 2310   ALT 14 02/27/2015 2310   ALKPHOS 46 02/27/2015 2310   BILITOT 0.6 02/27/2015 2310   GFRNONAA >60 03/02/2015 0549   GFRAA >60 03/02/2015 0549   PT/INR No results for input(s): LABPROT, INR in the last 72 hours.  Studies/Results: Dg Abd 2 Views  2015/03/24   CLINICAL DATA:  Came in 2 days ago to the ER with bowel obstruction.  EXAM: ABDOMEN - 2 VIEW  COMPARISON:  02/28/2015  FINDINGS: The bowel gas pattern is normal. There is no evidence of free air. No radio-opaque calculi or other significant radiographic abnormality is seen.  IMPRESSION: Negative.    Electronically Signed   By: Elige Ko   On: March 24, 2015 12:57    Assessment/Plan: 54 year old female admitted for small bowel obstruction. Obstruction appears to have resolved this patient is tolerating regular diet and having ostomy output. We'll evaluate again later today for possible discharge home.  Ricarda Frame, MD FACS General Surgeon [facility]

## 2015-03-03 NOTE — Progress Notes (Signed)
Pt stable. Pt d/c instructions given and education provided. IV removed. Questions answered. Pt escorted out by volunteer.

## 2015-03-13 ENCOUNTER — Ambulatory Visit: Payer: Self-pay | Admitting: General Surgery

## 2018-01-23 ENCOUNTER — Telehealth: Payer: Self-pay | Admitting: *Deleted

## 2018-01-23 DIAGNOSIS — Z122 Encounter for screening for malignant neoplasm of respiratory organs: Secondary | ICD-10-CM

## 2018-01-23 DIAGNOSIS — Z87891 Personal history of nicotine dependence: Secondary | ICD-10-CM

## 2018-01-23 NOTE — Telephone Encounter (Signed)
Received referral for initial lung cancer screening scan. Contacted patient and obtained smoking history,(former, quit 03/26/11, 39 pack year) as well as answering questions related to screening process. Patient denies signs of lung cancer such as weight loss or hemoptysis. Patient denies comorbidity that would prevent curative treatment if lung cancer were found. Patient is scheduled for shared decision making visit and CT scan on 02/09/18.

## 2018-02-08 ENCOUNTER — Encounter: Payer: Self-pay | Admitting: Nurse Practitioner

## 2018-02-09 ENCOUNTER — Ambulatory Visit
Admission: RE | Admit: 2018-02-09 | Discharge: 2018-02-09 | Disposition: A | Discharge status: Home / Self Care | Payer: Medicaid Other | Source: Ambulatory Visit | Attending: Nurse Practitioner | Admitting: Nurse Practitioner

## 2018-02-09 ENCOUNTER — Inpatient Hospital Stay: Payer: Medicaid Other | Attending: Nurse Practitioner | Admitting: Nurse Practitioner

## 2018-02-09 DIAGNOSIS — Z87891 Personal history of nicotine dependence: Secondary | ICD-10-CM | POA: Diagnosis not present

## 2018-02-09 DIAGNOSIS — J439 Emphysema, unspecified: Secondary | ICD-10-CM | POA: Diagnosis not present

## 2018-02-09 DIAGNOSIS — I2510 Atherosclerotic heart disease of native coronary artery without angina pectoris: Secondary | ICD-10-CM | POA: Diagnosis not present

## 2018-02-09 DIAGNOSIS — Z122 Encounter for screening for malignant neoplasm of respiratory organs: Secondary | ICD-10-CM

## 2018-02-09 NOTE — Progress Notes (Signed)
In accordance with CMS guidelines, patient has met eligibility criteria including age, absence of signs or symptoms of lung cancer.  Social History   Tobacco Use  . Smoking status: Former Smoker    Packs/day: 1.00    Years: 39.00    Pack years: 39.00    Types: Cigarettes    Last attempt to quit: 03/26/2011    Years since quitting: 6.8  Substance Use Topics  . Alcohol use: No  . Drug use: No      A shared decision-making session was conducted prior to the performance of CT scan. This includes one or more decision aids, includes benefits and harms of screening, follow-up diagnostic testing, over-diagnosis, false positive rate, and total radiation exposure.   Counseling on the importance of adherence to annual lung cancer LDCT screening, impact of co-morbidities, and ability or willingness to undergo diagnosis and treatment is imperative for compliance of the program.   Counseling on the importance of continued smoking cessation for former smokers; the importance of smoking cessation for current smokers, and information about tobacco cessation interventions have been given to patient including [place] and [employer] quit [place] programs.   Written order for lung cancer screening with LDCT has been given to the patient and any and all questions have been answered to the best of my abilities.    Yearly follow up will be coordinated by Burgess Estelle, Thoracic Navigator.  Beckey Rutter, DNP, AGNP-C [place] [facility] [phone] (work cell) [phone] (office) 02/09/18 3:08 PM

## 2018-02-10 ENCOUNTER — Telehealth: Payer: Self-pay | Admitting: *Deleted

## 2018-02-10 NOTE — Telephone Encounter (Signed)
Notified patient of LDCT lung cancer screening program results with recommendation for 6 month follow up imaging. Also notified of incidental findings noted below and is encouraged to discuss further with PCP who will receive a copy of this note and/or the CT report. Patient verbalizes understanding.   IMPRESSION: 1. Lung-RADS 3, probably benign findings. Short-term follow-up in 6 months is recommended with repeat low-dose chest CT without contrast (please use the following order, "CT CHEST LCS NODULE FOLLOW-UP W/O CM"). 2. One vessel coronary atherosclerosis.  Emphysema (ICD10-J43.9).

## 2018-08-04 ENCOUNTER — Telehealth: Payer: Self-pay

## 2018-08-04 ENCOUNTER — Telehealth: Payer: Self-pay | Admitting: *Deleted

## 2018-08-04 DIAGNOSIS — Z122 Encounter for screening for malignant neoplasm of respiratory organs: Secondary | ICD-10-CM

## 2018-08-04 NOTE — Telephone Encounter (Signed)
Patient has been notified that the annual lung cancer screening low dose CT scan is due currently or will be in the near future.  Confirmed that the patient is within the age range of 65-80, and asymptomatic, and currently exhibits no signs or symptoms of lung cancer.  Patient denies illness that would prevent curative treatment for lung cancer if found.  Verified smoking history, former smoker quit 03-26-11 with 39 pkyr hx.  The shared decision making visit was completed on 02-09-18.  Patient is agreeable for the CT scan to be scheduled.  Will call patient back with date and time of appointment.

## 2018-08-04 NOTE — Telephone Encounter (Signed)
Call pt regarding lung screening. PT isa former smoker. Pt would like scan after lunch. Pt denies any new health issues at this time.

## 2018-08-07 ENCOUNTER — Telehealth: Payer: Self-pay | Admitting: *Deleted

## 2018-08-07 NOTE — Telephone Encounter (Signed)
Called pt to inform her of her appt for ldct screening on Wednesday 08/09/2018 @ 1:40pm here @ [facility], message left for patient.

## 2018-08-08 ENCOUNTER — Telehealth: Payer: Self-pay | Admitting: *Deleted

## 2018-08-08 NOTE — Telephone Encounter (Signed)
Called pt to inform her of her appt change per her request to Wednesday 08/16/2018 @ 12:45pm, message left for pt, mailed appt

## 2018-08-09 ENCOUNTER — Ambulatory Visit: Admission: RE | Admit: 2018-08-09 | Payer: Medicaid Other | Source: Ambulatory Visit

## 2018-08-16 ENCOUNTER — Ambulatory Visit: Admission: RE | Admit: 2018-08-16 | Payer: Medicaid Other | Source: Ambulatory Visit

## 2018-08-17 ENCOUNTER — Encounter: Payer: Self-pay | Admitting: *Deleted

## 2018-08-25 ENCOUNTER — Telehealth: Payer: Self-pay | Admitting: *Deleted

## 2018-08-25 NOTE — Telephone Encounter (Signed)
Attempted to contact patient r/t LDCT Screening follow up due at this time.  No answer received, message left for patient to call [phone] to schedule appointment.    

## 2018-08-31 ENCOUNTER — Encounter: Payer: Self-pay | Admitting: *Deleted

## 2018-10-20 ENCOUNTER — Telehealth: Payer: Self-pay | Admitting: *Deleted

## 2018-10-20 ENCOUNTER — Encounter: Payer: Self-pay | Admitting: *Deleted

## 2018-10-20 NOTE — Telephone Encounter (Signed)
Attempted to contact patient to schedule lung screening follow up scan that is due. There is no answer or voicemail. Letter sent.
